# Patient Record
Sex: Female | Born: 1980 | Race: Black or African American | Hispanic: No | Marital: Single | Smoking: Never smoker
Health system: Southern US, Community
[De-identification: ages and names within clinical notes are randomized; demographics above are authoritative.]

## PROBLEM LIST (undated history)

## (undated) DIAGNOSIS — Z8619 Personal history of other infectious and parasitic diseases: Secondary | ICD-10-CM

## (undated) HISTORY — DX: Personal history of other infectious and parasitic diseases: Z86.19

---

## 2012-09-16 ENCOUNTER — Other Ambulatory Visit (HOSPITAL_COMMUNITY)
Admission: RE | Admit: 2012-09-16 | Discharge: 2012-09-16 | Disposition: A | Discharge status: Home / Self Care | Payer: BC Managed Care – PPO | Source: Ambulatory Visit | Attending: Family Medicine | Admitting: Family Medicine

## 2012-09-16 ENCOUNTER — Ambulatory Visit (INDEPENDENT_AMBULATORY_CARE_PROVIDER_SITE_OTHER): Payer: BC Managed Care – PPO | Admitting: Nurse Practitioner

## 2012-09-16 ENCOUNTER — Other Ambulatory Visit: Payer: Self-pay | Admitting: Nurse Practitioner

## 2012-09-16 ENCOUNTER — Encounter: Payer: Self-pay | Admitting: Nurse Practitioner

## 2012-09-16 VITALS — BP 100/70 | HR 86 | Temp 99.0°F | Ht 68.5 in | Wt 166.0 lb

## 2012-09-16 DIAGNOSIS — N760 Acute vaginitis: Secondary | ICD-10-CM | POA: Insufficient documentation

## 2012-09-16 DIAGNOSIS — Z113 Encounter for screening for infections with a predominantly sexual mode of transmission: Secondary | ICD-10-CM | POA: Insufficient documentation

## 2012-09-16 DIAGNOSIS — Z23 Encounter for immunization: Secondary | ICD-10-CM

## 2012-09-16 DIAGNOSIS — N939 Abnormal uterine and vaginal bleeding, unspecified: Secondary | ICD-10-CM

## 2012-09-16 DIAGNOSIS — N926 Irregular menstruation, unspecified: Secondary | ICD-10-CM

## 2012-09-16 DIAGNOSIS — Z111 Encounter for screening for respiratory tuberculosis: Secondary | ICD-10-CM

## 2012-09-16 DIAGNOSIS — Z0000 Encounter for general adult medical examination without abnormal findings: Secondary | ICD-10-CM

## 2012-09-16 DIAGNOSIS — R41840 Attention and concentration deficit: Secondary | ICD-10-CM

## 2012-09-16 DIAGNOSIS — Z1151 Encounter for screening for human papillomavirus (HPV): Secondary | ICD-10-CM | POA: Insufficient documentation

## 2012-09-16 DIAGNOSIS — Z124 Encounter for screening for malignant neoplasm of cervix: Secondary | ICD-10-CM

## 2012-09-16 DIAGNOSIS — E559 Vitamin D deficiency, unspecified: Secondary | ICD-10-CM

## 2012-09-16 DIAGNOSIS — Z01419 Encounter for gynecological examination (general) (routine) without abnormal findings: Secondary | ICD-10-CM | POA: Insufficient documentation

## 2012-09-16 LAB — HEPATIC FUNCTION PANEL
AST: 17 U/L (ref 0–37)
Alkaline Phosphatase: 51 U/L (ref 39–117)
Bilirubin, Direct: 0 mg/dL (ref 0.0–0.3)
Total Protein: 8.2 g/dL (ref 6.0–8.3)

## 2012-09-16 LAB — URINALYSIS, ROUTINE W REFLEX MICROSCOPIC
Nitrite: NEGATIVE
Total Protein, Urine: NEGATIVE
pH: 6 (ref 5.0–8.0)

## 2012-09-16 LAB — RENAL FUNCTION PANEL
Albumin: 4.4 g/dL (ref 3.5–5.2)
BUN: 7 mg/dL (ref 6–23)
CO2: 32 mEq/L (ref 19–32)
Chloride: 107 mEq/L (ref 96–112)
Creatinine, Ser: 0.8 mg/dL (ref 0.4–1.2)
Phosphorus: 3.6 mg/dL (ref 2.3–4.6)

## 2012-09-16 LAB — CBC
MCHC: 33 g/dL (ref 30.0–36.0)
Platelets: 296 10*3/uL (ref 150.0–400.0)
WBC: 6.6 10*3/uL (ref 4.5–10.5)

## 2012-09-16 MED ORDER — TETANUS-DIPHTH-ACELL PERTUSSIS 5-2.5-18.5 LF-MCG/0.5 IM SUSP
0.5000 mL | Freq: Once | INTRAMUSCULAR | Status: AC
  Administered 2012-09-16: 0.5 mL via INTRAMUSCULAR

## 2012-09-16 MED ORDER — TUBERCULIN PPD 5 UNIT/0.1ML ID SOLN
5.0000 [IU] | Freq: Once | INTRADERMAL | Status: AC
  Administered 2012-09-16: 5 [IU] via INTRADERMAL

## 2012-09-16 MED ORDER — HEPATITIS B VAC RECOMBINANT 5 MCG/0.5ML IJ SUSP
0.5000 mL | Freq: Once | INTRAMUSCULAR | Status: AC
  Administered 2012-09-16: 5 ug via INTRAMUSCULAR

## 2012-09-16 NOTE — Progress Notes (Signed)
Subjective:     Brooke Mckenzie is a 32 y.o. female and is here to establish care, get form filled out for job application, and complains of pain in pelvis when runs and spotting between menstrual cycles and when running or drinking alcohol. In addition, she thinks she has symptoms of attention deficit disorder.  History   Social History  . Marital Status: Single    Spouse Name: N/A    Number of Children: N/A  . Years of Education: N/A   Occupational History  . Not on file.   Social History Main Topics  . Smoking status: Never Smoker   . Smokeless tobacco: Never Used  . Alcohol Use: Yes     Comment: rare  . Drug Use: No  . Sexually Active: Yes   Other Topics Concern  . 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    No health maintenance topics applied.  The following portions of the patient's history were reviewed and updated as appropriate: allergies, current medications, past family history, past medical history, past social history, past surgical history and problem list.  Review of Systems Constitutional: negative for anorexia, chills, fatigue and night sweats Eyes: negative for irritation, redness and visual disturbance Ears, nose, mouth, throat, and face: negative for hoarseness, nasal congestion, sore mouth, sore throat and tinnitus Respiratory: negative for asthma, cough, dyspnea on exertion and wheezing Cardiovascular: negative for chest pain, chest pressure/discomfort, irregular heart beat and syncope Gastrointestinal: positive for abdominal pain, negative for change in bowel habits, constipation, diarrhea, nausea, reflux symptoms and vomiting Genitourinary:positive for bleeding between [facility] w/strenuous exercise , negative for dysuria and frequency Integument/breast: negative for breast lump, nipple discharge and rash Musculoskeletal:negative for arthralgias, back pain, muscle weakness and stiff  joints Neurological: positive for trouble focusing when reads, negative for coordination problems, dizziness, gait problems, headaches, memory problems, paresthesia and weakness Behavioral/Psych: negative for abusive relationship, anxiety, depression, excessive alcohol consumption, fatigue, illegal drug usage and irritability Endocrine: negative for diabetic symptoms including polydipsia, polyphagia and polyuria and temperature intolerance Allergic/Immunologic: negative for hay fever and urticaria  Skin: c/o blisters/hives when sweats Objective:    BP 100/70  Pulse 86  Temp(Src) 99 F (37.2 C) (Oral)  Ht 5' 8.5" (1.74 m)  Wt 166 lb (75.297 kg)  BMI 24.87 kg/m2  SpO2 99%  LMP 09/12/2012 General appearance: alert, cooperative, appears stated age and no distress Head: Normocephalic, without obvious abnormality, atraumatic Eyes: negative findings: lids and lashes normal, conjunctivae and sclerae normal, corneas clear and pupils equal, round, reactive to light and accomodation Ears: normal TM's and external ear canals both ears Throat: lips, mucosa, and tongue normal; teeth and gums normal and freckled tongue around periphery Back: symmetric, no curvature. ROM normal. No CVA tenderness. Lungs: clear to auscultation bilaterally Heart: regular rate and rhythm, S1, S2 normal, no murmur, click, rub or gallop Abdomen: soft, non-tender; bowel sounds normal; no masses,  no organomegaly Pelvic: adnexae not palpable, cervix normal in appearance, no adnexal masses or tenderness, no bladder tenderness, no cervical motion tenderness, perianal skin: no external genital warts noted, rectovaginal septum normal, uterus normal size, shape, and consistency and vagina normal without discharge Extremities: extremities normal, atraumatic, no cyanosis or edema Pulses: 2+ and symmetric Skin: Skin color, texture, turgor normal. No rashes or lesions Lymph nodes: Cervical, supraclavicular, and axillary nodes  normal. 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 Father had TB, rec PPD  cerv ca screen-PAP today 2 vit d def screen today 3 abnormal uterine bleeding-pelvic US STI screen today 4 problems maintaining attention, completing tasks   Plan:    1 CPE today. Abn findings: freckled tongue. Hx of blisters w/sweating-See pt instructions. Tdap & hep B #1 admin today, PPd admin today. 2 monitor results 3 monitor results, may coming from rectum-check blood in stool if findings normal 4 ref to psych for in depth eval for adult ADD

## 2012-09-16 NOTE — Patient Instructions (Addendum)
I will call you with results of labs. You will need to return for the reading of your PPD test in 48 hours. Also, return for Hep B vaccine #2 in one mo., then #3 in 4-5 months. For sweat/heat rash, use exfoliating cloths/gloves daily and only water soluble body lotions like [place], & loose clothes. Once I get the results of your PPD & HIV test, I will complete your form. You look great today, Pleasure to meet you!  Preventive Care for Adults, Female A healthy lifestyle and preventive care can promote health and wellness. Preventive health guidelines for women include the following key practices.  A routine yearly physical is a good way to check with your caregiver about your health and preventive screening. It is a chance to share any concerns and updates on your health, and to receive a thorough exam.  Visit your dentist for a routine exam and preventive care every 6 months. Brush your teeth twice a day and floss once a day. Good oral hygiene prevents tooth decay and gum disease.  The frequency of eye exams is based on your age, health, family medical history, use of contact lenses, and other factors. Follow your caregiver's recommendations for frequency of eye exams.  Eat a healthy diet. Foods like vegetables, fruits, whole grains, low-fat dairy products, and lean protein foods contain the nutrients you need without too many calories. Decrease your intake of foods high in solid fats, added sugars, and salt. Eat the right amount of calories for you.Get information about a proper diet from your caregiver, if necessary.  Regular physical exercise is one of the most important things you can do for your health. Most adults should get at least 150 minutes of moderate-intensity exercise (any activity that increases your heart rate and causes you to sweat) each week. In addition, most adults need muscle-strengthening exercises on 2 or more days a week.  Maintain a healthy weight. The body mass index (BMI)  is a screening tool to identify possible weight problems. It provides an estimate of body fat based on height and weight. Your caregiver can help determine your BMI, and can help you achieve or maintain a healthy weight.For adults 20 years and older:  A BMI below 18.5 is considered underweight.  A BMI of 18.5 to 24.9 is normal.  A BMI of 25 to 29.9 is considered overweight.  A BMI of 30 and above is considered obese.  Maintain normal blood lipids and cholesterol levels by exercising and minimizing your intake of saturated fat. Eat a balanced diet with plenty of fruit and vegetables. Blood tests for lipids and cholesterol should begin at age 11 and be repeated every 5 years. If your lipid or cholesterol levels are high, you are over 50, or you are at high risk for heart disease, you may need your cholesterol levels checked more frequently.Ongoing high lipid and cholesterol levels should be treated with medicines if diet and exercise are not effective.  If you smoke, find out from your caregiver how to quit. If you do not use tobacco, do not start.  If you are pregnant, do not drink alcohol. If you are breastfeeding, be very cautious about drinking alcohol. If you are not pregnant and choose to drink alcohol, do not exceed 1 drink per day. One drink is considered to be 12 ounces (355 mL) of beer, 5 ounces (148 mL) of wine, or 1.5 ounces (44 mL) of liquor.  Avoid use of street drugs. Do not share needles  with anyone. Ask for help if you need support or instructions about stopping the use of drugs.  High blood pressure causes heart disease and increases the risk of stroke. Your blood pressure should be checked at least every 1 to 2 years. Ongoing high blood pressure should be treated with medicines if weight loss and exercise are not effective.  If you are 69 to 31 years old, ask your caregiver if you should take aspirin to prevent strokes.  Diabetes screening involves taking a blood sample to  check your fasting blood sugar level. This should be done once every 3 years, after age 64, if you are within normal weight and without risk factors for diabetes. Testing should be considered at a younger age or be carried out more frequently if you are overweight and have at least 1 risk factor for diabetes.  Breast cancer screening is essential preventive care for women. You should practice "breast self-awareness." This means understanding the normal appearance and feel of your breasts and may include breast self-examination. Any changes detected, no matter how small, should be reported to a caregiver. Women in their 71s and 30s should have a clinical breast exam (CBE) by a caregiver as part of a regular health exam every 1 to 3 years. After age 67, women should have a CBE every year. Starting at age 71, women should consider having a mammography (breast X-ray test) every year. Women who have a family history of breast cancer should talk to their caregiver about genetic screening. Women at a high risk of breast cancer should talk to their caregivers about having magnetic resonance imaging (MRI) and a mammography every year.  The Pap test is a screening test for cervical cancer. A Pap test can show cell changes on the cervix that might become cervical cancer if left untreated. A Pap test is a procedure in which cells are obtained and examined from the lower end of the uterus (cervix).  Women should have a Pap test starting at age 88.  Between ages 55 and 66, Pap tests should be repeated every 2 years.  Beginning at age 61, you should have a Pap test every 3 years as long as the past 3 Pap tests have been normal.  Some women have medical problems that increase the chance of getting cervical cancer. Talk to your caregiver about these problems. It is especially important to talk to your caregiver if a new problem develops soon after your last Pap test. In these cases, your caregiver may recommend more  frequent screening and Pap tests.  The above recommendations are the same for women who have or have not gotten the vaccine for human papillomavirus (HPV).  If you had a hysterectomy for a problem that was not cancer or a condition that could lead to cancer, then you no longer need Pap tests. Even if you no longer need a Pap test, a regular exam is a good idea to make sure no other problems are starting.  If you are between ages 30 and 5, and you have had normal Pap tests going back 10 years, you no longer need Pap tests. Even if you no longer need a Pap test, a regular exam is a good idea to make sure no other problems are starting.  If you have had past treatment for cervical cancer or a condition that could lead to cancer, you need Pap tests and screening for cancer for at least 20 years after your treatment.  If Pap tests  have been discontinued, risk factors (such as a new sexual partner) need to be reassessed to determine if screening should be resumed.  The HPV test is an additional test that may be used for cervical cancer screening. The HPV test looks for the virus that can cause the cell changes on the cervix. The cells collected during the Pap test can be tested for HPV. The HPV test could be used to screen women aged 75 years and older, and should be used in women of any age who have unclear Pap test results. After the age of 69, women should have HPV testing at the same frequency as a Pap test.  Colorectal cancer can be detected and often prevented. Most routine colorectal cancer screening begins at the age of 55 and continues through age 28. However, your caregiver may recommend screening at an earlier age if you have risk factors for colon cancer. On a yearly basis, your caregiver may provide home test kits to check for hidden blood in the stool. Use of a small camera at the end of a tube, to directly examine the colon (sigmoidoscopy or colonoscopy), can detect the earliest forms of  colorectal cancer. Talk to your caregiver about this at age 88, when routine screening begins. Direct examination of the colon should be repeated every 5 to 10 years through age 83, unless early forms of pre-cancerous polyps or small growths are found.  Hepatitis C blood testing is recommended for all people born from [birth year] through 1965 and any individual with known risks for hepatitis C.  Practice safe sex. Use condoms and avoid high-risk sexual practices to reduce the spread of sexually transmitted infections (STIs). STIs include gonorrhea, chlamydia, syphilis, trichomonas, herpes, HPV, and human immunodeficiency virus (HIV). Herpes, HIV, and HPV are viral illnesses that have no cure. They can result in disability, cancer, and death. Sexually active women aged 18 and younger should be checked for chlamydia. Older women with new or multiple partners should also be tested for chlamydia. Testing for other STIs is recommended if you are sexually active and at increased risk.  Osteoporosis is a disease in which the bones lose minerals and strength with aging. This can result in serious bone fractures. The risk of osteoporosis can be identified using a bone density scan. Women ages 16 and over and women at risk for fractures or osteoporosis should discuss screening with their caregivers. Ask your caregiver whether you should take a calcium supplement or vitamin D to reduce the rate of osteoporosis.  Menopause can be associated with physical symptoms and risks. Hormone replacement therapy is available to decrease symptoms and risks. You should talk to your caregiver about whether hormone replacement therapy is right for you.  Use sunscreen with sun protection factor (SPF) of 30 or more. Apply sunscreen liberally and repeatedly throughout the day. You should seek shade when your shadow is shorter than you. Protect yourself by wearing long sleeves, pants, a wide-brimmed hat, and sunglasses year round, whenever  you are outdoors.  Once a month, do a whole body skin exam, using a mirror to look at the skin on your back. Notify your caregiver of new moles, moles that have irregular borders, moles that are larger than a pencil eraser, or moles that have changed in shape or color.  Stay current with required immunizations.  Influenza. You need a dose every fall (or winter). The composition of the flu vaccine changes each year, so being vaccinated once is not enough.  Pneumococcal polysaccharide.  You need 1 to 2 doses if you smoke cigarettes or if you have certain chronic medical conditions. You need 1 dose at age 71 (or older) if you have never been vaccinated.  Tetanus, diphtheria, pertussis (Tdap, Td). Get 1 dose of Tdap vaccine if you are younger than age 67, are over 64 and have contact with an infant, are a Research scientist (physical sciences), are pregnant, or simply want to be protected from whooping cough. After that, you need a Td booster dose every 10 years. Consult your caregiver if you have not had at least 3 tetanus and diphtheria-containing shots sometime in your life or have a deep or dirty wound.  HPV. You need this vaccine if you are a woman age 34 or younger. The vaccine is given in 3 doses over 6 months.  Measles, mumps, rubella (MMR). You need at least 1 dose of MMR if you were born in 1957 or later. You may also need a second dose.  Meningococcal. If you are age 46 to 46 and a first-year college student living in a residence hall, or have one of several medical conditions, you need to get vaccinated against meningococcal disease. You may also need additional booster doses.  Zoster (shingles). If you are age 27 or older, you should get this vaccine.  Varicella (chickenpox). If you have never had chickenpox or you were vaccinated but received only 1 dose, talk to your caregiver to find out if you need this vaccine.  Hepatitis A. You need this vaccine if you have a specific risk factor for hepatitis A virus  infection or you simply wish to be protected from this disease. The vaccine is usually given as 2 doses, 6 to 18 months apart.  Hepatitis B. You need this vaccine if you have a specific risk factor for hepatitis B virus infection or you simply wish to be protected from this disease. The vaccine is given in 3 doses, usually over 6 months. Preventive Services / Frequency Ages 23 to 29  Blood pressure check.** / Every 1 to 2 years.  Lipid and cholesterol check.** / Every 5 years beginning at age 63.  Clinical breast exam.** / Every 3 years for women in their 28s and 30s.  Pap test.** / Every 2 years from ages 64 through 33. Every 3 years starting at age 34 through age 90 or 6 with a history of 3 consecutive normal Pap tests.  HPV screening.** / Every 3 years from ages 71 through ages 40 to 78 with a history of 3 consecutive normal Pap tests.  Hepatitis C blood test.** / For any individual with known risks for hepatitis C.  Skin self-exam. / Monthly.  Influenza immunization.** / Every year.  Pneumococcal polysaccharide immunization.** / 1 to 2 doses if you smoke cigarettes or if you have certain chronic medical conditions.  Tetanus, diphtheria, pertussis (Tdap, Td) immunization. / A one-time dose of Tdap vaccine. After that, you need a Td booster dose every 10 years.  HPV immunization. / 3 doses over 6 months, if you are 27 and younger.  Measles, mumps, rubella (MMR) immunization. / You need at least 1 dose of MMR if you were born in 1957 or later. You may also need a second dose.  Meningococcal immunization. / 1 dose if you are age 28 to 85 and a first-year college student living in a residence hall, or have one of several medical conditions, you need to get vaccinated against meningococcal disease. You may also need additional booster doses.  Varicella immunization.** / Consult your caregiver.  Hepatitis A immunization.** / Consult your caregiver. 2 doses, 6 to 18 months  apart.  Hepatitis B immunization.** / Consult your caregiver. 3 doses usually over 6 months. Ages 51 to 82  Blood pressure check.** / Every 1 to 2 years.  Lipid and cholesterol check.** / Every 5 years beginning at age 81.  Clinical breast exam.** / Every year after age 68.  Mammogram.** / Every year beginning at age 1 and continuing for as long as you are in good health. Consult with your caregiver.  Pap test.** / Every 3 years starting at age 39 through age 43 or 16 with a history of 3 consecutive normal Pap tests.  HPV screening.** / Every 3 years from ages 42 through ages 28 to 100 with a history of 3 consecutive normal Pap tests.  Fecal occult blood test (FOBT) of stool. / Every year beginning at age 70 and continuing until age 33. You may not need to do this test if you get a colonoscopy every 10 years.  Flexible sigmoidoscopy or colonoscopy.** / Every 5 years for a flexible sigmoidoscopy or every 10 years for a colonoscopy beginning at age 68 and continuing until age 87.  Hepatitis C blood test.** / For all people born from 1 through 1965 and any individual with known risks for hepatitis C.  Skin self-exam. / Monthly.  Influenza immunization.** / Every year.  Pneumococcal polysaccharide immunization.** / 1 to 2 doses if you smoke cigarettes or if you have certain chronic medical conditions.  Tetanus, diphtheria, pertussis (Tdap, Td) immunization.** / A one-time dose of Tdap vaccine. After that, you need a Td booster dose every 10 years.  Measles, mumps, rubella (MMR) immunization. / You need at least 1 dose of MMR if you were born in 1957 or later. You may also need a second dose.  Varicella immunization.** / Consult your caregiver.  Meningococcal immunization.** / Consult your caregiver.  Hepatitis A immunization.** / Consult your caregiver. 2 doses, 6 to 18 months apart.  Hepatitis B immunization.** / Consult your caregiver. 3 doses, usually over 6 months. Ages 40  and over  Blood pressure check.** / Every 1 to 2 years.  Lipid and cholesterol check.** / Every 5 years beginning at age 39.  Clinical breast exam.** / Every year after age 36.  Mammogram.** / Every year beginning at age 44 and continuing for as long as you are in good health. Consult with your caregiver.  Pap test.** / Every 3 years starting at age 27 through age 68 or 48 with a 3 consecutive normal Pap tests. Testing can be stopped between 65 and 70 with 3 consecutive normal Pap tests and no abnormal Pap or HPV tests in the past 10 years.  HPV screening.** / Every 3 years from ages 37 through ages 27 or 45 with a history of 3 consecutive normal Pap tests. Testing can be stopped between 65 and 70 with 3 consecutive normal Pap tests and no abnormal Pap or HPV tests in the past 10 years.  Fecal occult blood test (FOBT) of stool. / Every year beginning at age 43 and continuing until age 75. You may not need to do this test if you get a colonoscopy every 10 years.  Flexible sigmoidoscopy or colonoscopy.** / Every 5 years for a flexible sigmoidoscopy or every 10 years for a colonoscopy beginning at age 31 and continuing until age 27.  Hepatitis C blood test.** / For all people born  from 1944 through 1965 and any individual with known risks for hepatitis C.  Osteoporosis screening.** / A one-time screening for women ages 73 and over and women at risk for fractures or osteoporosis.  Skin self-exam. / Monthly.  Influenza immunization.** / Every year.  Pneumococcal polysaccharide immunization.** / 1 dose at age 77 (or older) if you have never been vaccinated.  Tetanus, diphtheria, pertussis (Tdap, Td) immunization. / A one-time dose of Tdap vaccine if you are over 65 and have contact with an infant, are a Research scientist (physical sciences), or simply want to be protected from whooping cough. After that, you need a Td booster dose every 10 years.  Varicella immunization.** / Consult your  caregiver.  Meningococcal immunization.** / Consult your caregiver.  Hepatitis A immunization.** / Consult your caregiver. 2 doses, 6 to 18 months apart.  Hepatitis B immunization.** / Check with your caregiver. 3 doses, usually over 6 months. ** Family history and personal history of risk and conditions may change your caregiver's recommendations. Document Released: 04/24/2001 Document Revised: 05/21/2011 Document Reviewed: 07/24/2010 [facility] Patient Information 2014 [place], [place].

## 2012-09-17 ENCOUNTER — Ambulatory Visit (HOSPITAL_BASED_OUTPATIENT_CLINIC_OR_DEPARTMENT_OTHER)
Admission: RE | Admit: 2012-09-17 | Discharge: 2012-09-17 | Disposition: A | Discharge status: Home / Self Care | Payer: BC Managed Care – PPO | Source: Ambulatory Visit | Attending: Nurse Practitioner | Admitting: Nurse Practitioner

## 2012-09-17 DIAGNOSIS — N939 Abnormal uterine and vaginal bleeding, unspecified: Secondary | ICD-10-CM

## 2012-09-17 DIAGNOSIS — N949 Unspecified condition associated with female genital organs and menstrual cycle: Secondary | ICD-10-CM | POA: Insufficient documentation

## 2012-09-17 DIAGNOSIS — N938 Other specified abnormal uterine and vaginal bleeding: Secondary | ICD-10-CM | POA: Insufficient documentation

## 2012-09-19 ENCOUNTER — Other Ambulatory Visit (HOSPITAL_BASED_OUTPATIENT_CLINIC_OR_DEPARTMENT_OTHER): Payer: BC Managed Care – PPO

## 2012-09-23 ENCOUNTER — Telehealth: Payer: Self-pay | Admitting: Nurse Practitioner

## 2012-09-23 ENCOUNTER — Encounter: Payer: Self-pay | Admitting: Nurse Practitioner

## 2012-09-23 NOTE — Telephone Encounter (Signed)
See telephone note. Advised pt L ovary not visualized on pelvic US-may be due to gas in bowel. At f/u appt. I will recheck vit D and give hemoccult cards to screen for blood in stool as Hgb slightly low and pt c/o spot of blood on underwear when runs or drinks ETOH.

## 2012-09-24 ENCOUNTER — Telehealth: Payer: Self-pay | Admitting: Nurse Practitioner

## 2012-09-24 NOTE — Telephone Encounter (Signed)
Patient notified of appointment times.

## 2012-09-24 NOTE — Telephone Encounter (Signed)
VM is full. Need to give patient appt info.

## 2012-09-30 ENCOUNTER — Telehealth: Payer: Self-pay | Admitting: Nurse Practitioner

## 2012-09-30 DIAGNOSIS — D649 Anemia, unspecified: Secondary | ICD-10-CM

## 2012-09-30 NOTE — Telephone Encounter (Signed)
I am concerned about Peutz-Jeghers Syndrome as pt has freckled tongue and is mildly anemic and c/o vague abdominal pain. She gives family hx of her mother also having freckled tongue, but not to the extent that she has it. Her mother has had no GI, anemia, or Ca diagnoses that pt is aware of. Our office will mail ifob kit. Pt will return kit when she comes in for Hep B vaccine #2. She will also go to lab for iron studies.

## 2012-10-20 ENCOUNTER — Ambulatory Visit (INDEPENDENT_AMBULATORY_CARE_PROVIDER_SITE_OTHER): Payer: BC Managed Care – PPO | Admitting: Nurse Practitioner

## 2012-10-20 ENCOUNTER — Other Ambulatory Visit (HOSPITAL_COMMUNITY)
Admission: RE | Admit: 2012-10-20 | Discharge: 2012-10-20 | Disposition: A | Discharge status: Home / Self Care | Payer: BC Managed Care – PPO | Source: Ambulatory Visit | Attending: Family Medicine | Admitting: Family Medicine

## 2012-10-20 ENCOUNTER — Ambulatory Visit: Payer: BC Managed Care – PPO

## 2012-10-20 DIAGNOSIS — N760 Acute vaginitis: Secondary | ICD-10-CM | POA: Insufficient documentation

## 2012-10-20 DIAGNOSIS — N898 Other specified noninflammatory disorders of vagina: Secondary | ICD-10-CM

## 2012-10-20 DIAGNOSIS — Z23 Encounter for immunization: Secondary | ICD-10-CM

## 2012-10-20 NOTE — Addendum Note (Signed)
Addended by: Alben Spittle, Dayne Dekay [facility] on: 10/20/2012 12:50 PM   Modules accepted: Orders, Medications, Level of Service

## 2012-10-20 NOTE — Progress Notes (Signed)
Subjective:     Brooke Mckenzie is a 32 y.o. female who presents for evaluation of an abnormal vaginal discharge. Symptoms have been present for a few weeks. Vaginal symptoms: odor and Farro discharge. Contraception: none. She denies blisters, bumps, burning, lesions and local irritation Sexually transmitted infection risk: very low risk of STD exposure. Menstrual flow: regular every 28-30 days.  The following portions of the patient's history were reviewed and updated as appropriate: allergies, current medications, past family history, past medical history, past social history, past surgical history and problem list.   Review of Systems Constitutional: positive for anorexia, chills, fatigue, fevers, night sweats and weight loss Eyes: negative for irritation and redness Ears, nose, mouth, throat, and face: negative for sore throat Gastrointestinal: negative for abdominal pain and vomiting Genitourinary:positive for vaginal discharge and odor, negative for dysuria, frequency and hesitancy    Objective:    There were no vitals taken for this visit.  General Appearance:    Alert, cooperative, no distress, appears stated age  Head:    Normocephalic, without obvious abnormality, atraumatic  Eyes:  Lids & lashes no discharge, sclera Kleiber. L lid ptosis                             Abdomen:     Soft, non-tender, bowel sounds active all four quadrants,    no masses, no organomegaly  Genitalia:    Normal female without lesion, copious thin Navarrette discharge noted. No tenderness. Scant bleeding when swabbed cervix.                        Assessment:    Bacterial vaginosis.    Plan:    wet prep sent. see pt instructions.

## 2012-10-20 NOTE — Patient Instructions (Addendum)
I will call you with test results and advise treatment. Great to see you!  Bacterial Vaginosis Bacterial vaginosis (BV) is a vaginal infection where the normal balance of bacteria in the vagina is disrupted. The normal balance is then replaced by an overgrowth of certain bacteria. There are several different kinds of bacteria that can cause BV. BV is the most common vaginal infection in women of childbearing age. CAUSES   The cause of BV is not fully understood. BV develops when there is an increase or imbalance of harmful bacteria.  Some activities or behaviors can upset the normal balance of bacteria in the vagina and put women at increased risk including:  Having a new sex partner or multiple sex partners.  Douching.  Using an intrauterine device (IUD) for contraception.  It is not clear what role sexual activity plays in the development of BV. However, women that have never had sexual intercourse are rarely infected with BV. Women do not get BV from toilet seats, bedding, swimming pools or from touching objects around them.  SYMPTOMS   Grey vaginal discharge.  A fish-like odor with discharge, especially after sexual intercourse.  Itching or burning of the vagina and vulva.  Burning or pain with urination.  Some women have no signs or symptoms at all. DIAGNOSIS  Your caregiver must examine the vagina for signs of BV. Your caregiver will perform lab tests and look at the sample of vaginal fluid through a microscope. They will look for bacteria and abnormal cells (clue cells), a pH test higher than 4.5, and a positive amine test all associated with BV.  RISKS AND COMPLICATIONS   Pelvic inflammatory disease (PID).  Infections following gynecology surgery.  Developing HIV.  Developing herpes virus. TREATMENT  Sometimes BV will clear up without treatment. However, all women with symptoms of BV should be treated to avoid complications, especially if gynecology surgery is  planned. Female partners generally do not need to be treated. However, BV may spread between female sex partners so treatment is helpful in preventing a recurrence of BV.   BV may be treated with antibiotics. The antibiotics come in either pill or vaginal cream forms. Either can be used with nonpregnant or pregnant women, but the recommended dosages differ. These antibiotics are not harmful to the baby.  BV can recur after treatment. If this happens, a second round of antibiotics will often be prescribed.  Treatment is important for pregnant women. If not treated, BV can cause a premature delivery, especially for a pregnant woman who had a premature birth in the past. All pregnant women who have symptoms of BV should be checked and treated.  For chronic reoccurrence of BV, treatment with a type of prescribed gel vaginally twice a week is helpful. HOME CARE INSTRUCTIONS   Finish all medication as directed by your caregiver.  Do not have sex until treatment is completed.  Tell your sexual partner that you have a vaginal infection. They should see their caregiver and be treated if they have problems, such as a mild rash or itching.  Practice safe sex. Use condoms. Only have 1 sex partner. PREVENTION  Basic prevention steps can help reduce the risk of upsetting the natural balance of bacteria in the vagina and developing BV:  Do not have sexual intercourse (be abstinent).  Do not douche.  Use all of the medicine prescribed for treatment of BV, even if the signs and symptoms go away.  Tell your sex partner if you have BV. That  way, they can be treated, if needed, to prevent reoccurrence. SEEK MEDICAL CARE IF:   Your symptoms are not improving after 3 days of treatment.  You have increased discharge, pain, or fever. MAKE SURE YOU:   Understand these instructions.  Will watch your condition.  Will get help right away if you are not doing well or get worse. FOR MORE INFORMATION    Division of STD Prevention (DSTDP), Centers for Disease Control and Prevention: [employer] American Social Health Association (ASHA): www.ashastd.org  Document Released: 02/26/2005 Document Revised: 05/21/2011 Document Reviewed: 08/19/2008 [facility] Patient Information 2014 [place], [place].

## 2012-10-21 ENCOUNTER — Telehealth: Payer: Self-pay | Admitting: Nurse Practitioner

## 2012-10-21 DIAGNOSIS — B9689 Other specified bacterial agents as the cause of diseases classified elsewhere: Secondary | ICD-10-CM

## 2012-10-21 MED ORDER — METRONIDAZOLE 0.75 % VA GEL: 1.0000 | Freq: Two times a day (BID) | VAGINAL | Status: DC

## 2012-10-21 NOTE — Addendum Note (Signed)
Addended by: Alben Spittle, Konrad Felix COX on: 10/21/2012 10:54 AM   Modules accepted: Orders

## 2012-10-21 NOTE — Telephone Encounter (Signed)
Wet prep is positive for gardnerella, will treat with metrogel-vaginal. Discussed with pt.

## 2012-10-31 ENCOUNTER — Telehealth: Payer: Self-pay | Admitting: Nurse Practitioner

## 2012-10-31 DIAGNOSIS — B373 Candidiasis of vulva and vagina: Secondary | ICD-10-CM

## 2012-10-31 MED ORDER — MICONAZOLE NITRATE 1200 & 2 MG & % VA KIT: 1.0000 | PACK | Freq: Once | VAGINAL | Status: DC

## 2012-10-31 NOTE — Telephone Encounter (Signed)
pt called c/o vulvar itching times 1 day after completing 7 day course metronidazole vag suppositories. thinks starting mc 1 week early. Advised to use monistat vag supp, 7day with topical cream. call back if symptoms do not resolve

## 2012-11-03 ENCOUNTER — Other Ambulatory Visit (INDEPENDENT_AMBULATORY_CARE_PROVIDER_SITE_OTHER): Payer: BC Managed Care – PPO

## 2012-11-03 DIAGNOSIS — D649 Anemia, unspecified: Secondary | ICD-10-CM

## 2012-11-04 LAB — FECAL OCCULT BLOOD, IMMUNOCHEMICAL: Fecal Occult Bld: NEGATIVE

## 2012-12-15 ENCOUNTER — Other Ambulatory Visit: Payer: BC Managed Care – PPO

## 2012-12-22 ENCOUNTER — Ambulatory Visit: Payer: BC Managed Care – PPO | Admitting: Nurse Practitioner

## 2013-01-15 ENCOUNTER — Other Ambulatory Visit: Payer: Self-pay

## 2013-01-22 ENCOUNTER — Ambulatory Visit: Payer: Self-pay | Admitting: Internal Medicine

## 2013-01-26 ENCOUNTER — Ambulatory Visit: Payer: BC Managed Care – PPO

## 2013-02-24 ENCOUNTER — Telehealth: Payer: Self-pay | Admitting: Nurse Practitioner

## 2013-02-25 NOTE — Telephone Encounter (Signed)
LMOVM for patient to return call.

## 2013-03-24 ENCOUNTER — Telehealth: Payer: Self-pay | Admitting: Nurse Practitioner

## 2013-03-24 NOTE — Telephone Encounter (Signed)
Patient has not been taking the vitamin D supplement but is going to start today. She has scheduled an OV in 30 days to have her vit D level checked. She would like to know if you need to see her before then. Patient also lost the names of the people that you gave her to see for her ADD issues. Can she get those again?

## 2013-03-24 NOTE — Telephone Encounter (Signed)
Patient returned call and schedule/d appointment

## 2013-03-26 NOTE — Telephone Encounter (Signed)
Please call pt to set up OV at her earliest convenience. At that time she can have labs drawn for iron studies (were ordered last summer & not done) and get last Hep B vaccine. Too soon to do Vit D level. We can do that in 6 mos.

## 2013-03-30 NOTE — Telephone Encounter (Signed)
Brooke Mckenzie. [phone].

## 2013-03-30 NOTE — Telephone Encounter (Signed)
What was the name of person that you recommend for ADD issues?

## 2013-04-02 NOTE — Telephone Encounter (Signed)
Patient advised.

## 2013-04-21 ENCOUNTER — Encounter: Payer: Self-pay | Admitting: Nurse Practitioner

## 2013-04-21 ENCOUNTER — Ambulatory Visit (INDEPENDENT_AMBULATORY_CARE_PROVIDER_SITE_OTHER): Payer: BC Managed Care – PPO | Admitting: Nurse Practitioner

## 2013-04-21 VITALS — BP 116/80 | HR 77 | Temp 99.0°F | Resp 18 | Ht 68.5 in | Wt 174.0 lb

## 2013-04-21 DIAGNOSIS — K148 Other diseases of tongue: Secondary | ICD-10-CM | POA: Insufficient documentation

## 2013-04-21 DIAGNOSIS — D649 Anemia, unspecified: Secondary | ICD-10-CM | POA: Insufficient documentation

## 2013-04-21 DIAGNOSIS — Z23 Encounter for immunization: Secondary | ICD-10-CM

## 2013-04-21 DIAGNOSIS — Z9109 Other allergy status, other than to drugs and biological substances: Secondary | ICD-10-CM

## 2013-04-21 LAB — IRON AND TIBC
%SAT: 5 % — AB (ref 20–55)
IRON: 19 ug/dL — AB (ref 42–145)
TIBC: 411 ug/dL (ref 250–470)
UIBC: 392 ug/dL (ref 125–400)

## 2013-04-21 LAB — CBC WITH DIFFERENTIAL/PLATELET
BASOS PCT: 0.4 % (ref 0.0–3.0)
Basophils Absolute: 0 10*3/uL (ref 0.0–0.1)
EOS ABS: 0.1 10*3/uL (ref 0.0–0.7)
EOS PCT: 1.5 % (ref 0.0–5.0)
HEMATOCRIT: 32.1 % — AB (ref 36.0–46.0)
HEMOGLOBIN: 10.1 g/dL — AB (ref 12.0–15.0)
LYMPHS ABS: 1.4 10*3/uL (ref 0.7–4.0)
Lymphocytes Relative: 18.8 % (ref 12.0–46.0)
MCHC: 31.3 g/dL (ref 30.0–36.0)
MCV: 80.8 fl (ref 78.0–100.0)
MONO ABS: 0.4 10*3/uL (ref 0.1–1.0)
Monocytes Relative: 5.6 % (ref 3.0–12.0)
NEUTROS ABS: 5.5 10*3/uL (ref 1.4–7.7)
Neutrophils Relative %: 73.7 % (ref 43.0–77.0)
Platelets: 296 10*3/uL (ref 150.0–400.0)
RBC: 3.98 Mil/uL (ref 3.87–5.11)
RDW: 17.1 % — ABNORMAL HIGH (ref 11.5–14.6)
WBC: 7.5 10*3/uL (ref 4.5–10.5)

## 2013-04-21 LAB — FERRITIN: Ferritin: 4.5 ng/mL — ABNORMAL LOW (ref 10.0–291.0)

## 2013-04-21 NOTE — Assessment & Plan Note (Signed)
C/o itchy ears. NML exam. Will start sinus rinses daily. Let me know if no improvement.

## 2013-04-21 NOTE — Assessment & Plan Note (Addendum)
Mildly low hemoglobin 11.1. Fecal occult stool negative. Iron studies pending.

## 2013-04-21 NOTE — Progress Notes (Signed)
Subjective:     Brooke EvenerMishonna Mckenzie is a 33 y.o. female who presents for follow up of mild anemia found on routine labs at [facility]. Associated signs & symptoms: fatigue.  The following portions of the patient's history were reviewed and updated as appropriate: allergies, current medications, past medical history, past social history, past surgical history and problem list. Review of Systems Constitutional: positive for fatigue, negative for fevers and weight loss Ears, nose, mouth, throat, and face: positive for itchy ears, negative for hoarseness and nasal congestion Respiratory: negative for cough Cardiovascular: negative for chest pressure/discomfort, irregular heart beat, lower extremity edema and near-syncope Behavioral/Psych: negative for tobacco use Allergic/Immunologic: positive for itchy ears, negative for itchy throat or nasal congestion       Objective:    BP 116/80  Pulse 77  Temp(Src) 99 F (37.2 C) (Temporal)  Resp 18  Ht 5' 8.5" (1.74 m)  Wt 174 lb (78.926 kg)  BMI 26.07 kg/m2  SpO2 100%  LMP 03/27/2013 General appearance: alert, cooperative, appears stated age, no distress and weight up 8 lbs. in 6 mos. Head: Normocephalic, without obvious abnormality, atraumatic Eyes: negative findings: lids and lashes normal and conjunctivae and sclerae normal Throat: mucosa moist, pink, scattered dark freckles on tongue. Lungs: clear to auscultation bilaterally Heart: regular rate and rhythm, S1, S2 normal, no murmur, click, rub or gallop Lymph nodes: enlarged anterior tonsillar nodes, bilat, NT, moveable.    Assessment:    Anemia, origin unknown , mild, reports fatigue Itchy ears, nml exam. DD: environmental allergies, psoriasis  Plan:    labs: CBC iron studies  Daily sinus rinses to decrease allergenic load. Allergy meds if no improvement. F/u in 1 yr or sooner if labs abnml.

## 2013-04-21 NOTE — Progress Notes (Signed)
Pre visit review using our clinic review tool, if applicable. No additional management support is needed unless otherwise documented below in the visit note. 

## 2013-04-21 NOTE — Patient Instructions (Signed)
For itchy ears, you may have allergies. Start sinus rinses Milta Deiters med sinus rinse) daily to decrease allergenic load. Let me know if this does not help. Our office will call you with lab results and any treatment needed.  Develop lifelong habits of exercise most days of the week: take a 30 minute walk. The benefits include weight loss, lower risk for heart disease, diabetes, stroke, high blood pressure, lower rates of depression & dementia, better sleep quality & bone health.  Great to see you! Best wishes with new business!  Allergies Allergies may happen from anything your body is sensitive to. This may be food, medicines, pollens, chemicals, and nearly anything around you in everyday life that produces allergens. An allergen is anything that causes an allergy producing substance. Heredity is often a factor in causing these problems. This means you may have some of the same allergies as your parents. Food allergies happen in all age groups. Food allergies are some of the most severe and life threatening. Some common food allergies are cow's milk, seafood, eggs, nuts, wheat, and soybeans. SYMPTOMS   Swelling around the mouth.  An itchy red rash or hives.  Vomiting or diarrhea.  Difficulty breathing. SEVERE ALLERGIC REACTIONS ARE LIFE-THREATENING. This reaction is called anaphylaxis. It can cause the mouth and throat to swell and cause difficulty with breathing and swallowing. In severe reactions only a trace amount of food (for example, peanut oil in a salad) may cause death within seconds. Seasonal allergies occur in all age groups. These are seasonal because they usually occur during the same season every year. They may be a reaction to molds, grass pollens, or tree pollens. Other causes of problems are house dust mite allergens, pet dander, and mold spores. The symptoms often consist of nasal congestion, a runny itchy nose associated with sneezing, and tearing itchy eyes. There is often an  associated itching of the mouth and ears. The problems happen when you come in contact with pollens and other allergens. Allergens are the particles in the air that the body reacts to with an allergic reaction. This causes you to release allergic antibodies. Through a chain of events, these eventually cause you to release histamine into the blood stream. Although it is meant to be protective to the body, it is this release that causes your discomfort. This is why you were given anti-histamines to feel better. If you are unable to pinpoint the offending allergen, it may be determined by skin or blood testing. Allergies cannot be cured but can be controlled with medicine. Hay fever is a collection of all or some of the seasonal allergy problems. It may often be treated with simple over-the-counter medicine such as diphenhydramine. Take medicine as directed. Do not drink alcohol or drive while taking this medicine. Check with your caregiver or package insert for child dosages. If these medicines are not effective, there are many new medicines your caregiver can prescribe. Stronger medicine such as nasal spray, eye drops, and corticosteroids may be used if the first things you try do not work well. Other treatments such as immunotherapy or desensitizing injections can be used if all else fails. Follow up with your caregiver if problems continue. These seasonal allergies are usually not life threatening. They are generally more of a nuisance that can often be handled using medicine. HOME CARE INSTRUCTIONS   If unsure what causes a reaction, keep a diary of foods eaten and symptoms that follow. Avoid foods that cause reactions.  If hives or  rash are present:  Take medicine as directed.  You may use an over-the-counter antihistamine (diphenhydramine) for hives and itching as needed.  Apply cold compresses (cloths) to the skin or take baths in cool water. Avoid hot baths or showers. Heat will make a rash and  itching worse.  If you are severely allergic:  Following a treatment for a severe reaction, hospitalization is often required for closer follow-up.  Wear a medic-alert bracelet or necklace stating the allergy.  You and your family must learn how to give adrenaline or use an anaphylaxis kit.  If you have had a severe reaction, always carry your anaphylaxis kit or EpiPen with you. Use this medicine as directed by your caregiver if a severe reaction is occurring. Failure to do so could have a fatal outcome. SEEK MEDICAL CARE IF:  You suspect a food allergy. Symptoms generally happen within 30 minutes of eating a food.  Your symptoms have not gone away within 2 days or are getting worse.  You develop new symptoms.  You want to retest yourself or your child with a food or drink you think causes an allergic reaction. Never do this if an anaphylactic reaction to that food or drink has happened before. Only do this under the care of a caregiver. SEEK IMMEDIATE MEDICAL CARE IF:   You have difficulty breathing, are wheezing, or have a tight feeling in your chest or throat.  You have a swollen mouth, or you have hives, swelling, or itching all over your body.  You have had a severe reaction that has responded to your anaphylaxis kit or an EpiPen. These reactions may return when the medicine has worn off. These reactions should be considered life threatening. MAKE SURE YOU:   Understand these instructions.  Will watch your condition.  Will get help right away if you are not doing well or get worse. Document Released: 05/22/2002 Document Revised: 06/23/2012 Document Reviewed: 10/27/2007 [facility] Patient Information 2014 [place].

## 2013-04-22 ENCOUNTER — Other Ambulatory Visit: Payer: Self-pay | Admitting: Nurse Practitioner

## 2013-04-22 DIAGNOSIS — D509 Iron deficiency anemia, unspecified: Secondary | ICD-10-CM

## 2013-04-22 MED ORDER — FERROUS SULFATE 325 (65 FE) MG PO TABS: ORAL_TABLET | ORAL | Status: AC

## 2013-05-21 ENCOUNTER — Encounter: Payer: Self-pay | Admitting: Nurse Practitioner

## 2013-05-21 DIAGNOSIS — F988 Other specified behavioral and emotional disorders with onset usually occurring in childhood and adolescence: Secondary | ICD-10-CM | POA: Insufficient documentation

## 2013-06-22 ENCOUNTER — Ambulatory Visit: Payer: BC Managed Care – PPO | Admitting: Nurse Practitioner

## 2013-07-15 ENCOUNTER — Encounter: Payer: Self-pay | Admitting: Nurse Practitioner

## 2013-08-06 ENCOUNTER — Encounter: Payer: Self-pay | Admitting: Nurse Practitioner

## 2014-12-11 IMAGING — US US TRANSVAGINAL NON-OB
1 series · 14 of 25 positions shown · non-contrast
Comparison: None

CLINICAL DATA: Dysfunctional uterine bleeding.

TRANSABDOMINAL AND TRANSVAGINAL ULTRASOUND OF PELVIS
TECHNIQUE: Both transabdominal and transvaginal ultrasound
examinations of the pelvis were performed including evaluation of
the uterus, ovaries, adnexal regions, and pelvic cul-de-sac.

[Series 1: us transvaginal non-ob · 0.33mm/px · 14 of 31 slices shown]
[im 1/31]
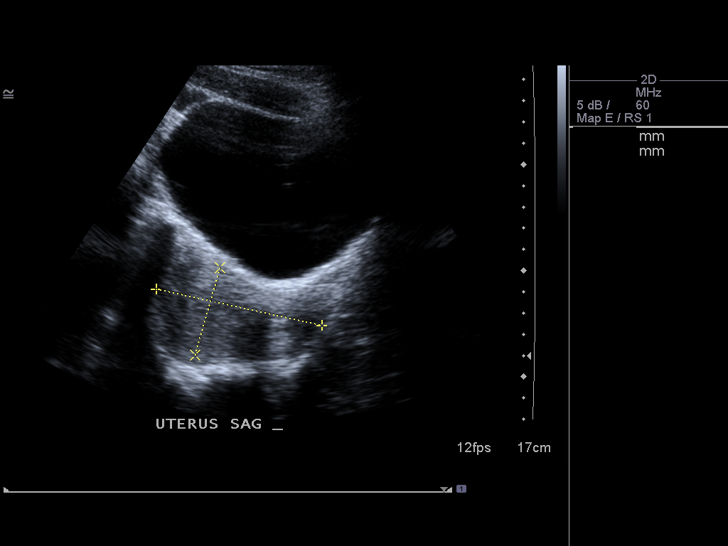
[im 3/31]
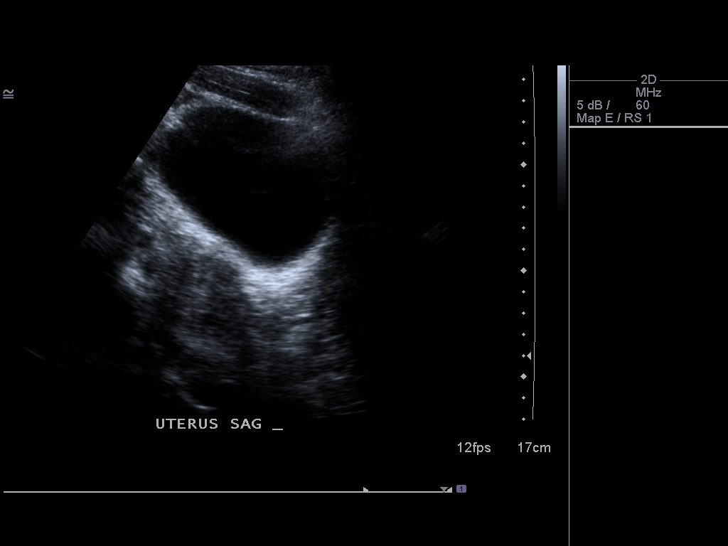
[im 6/31]
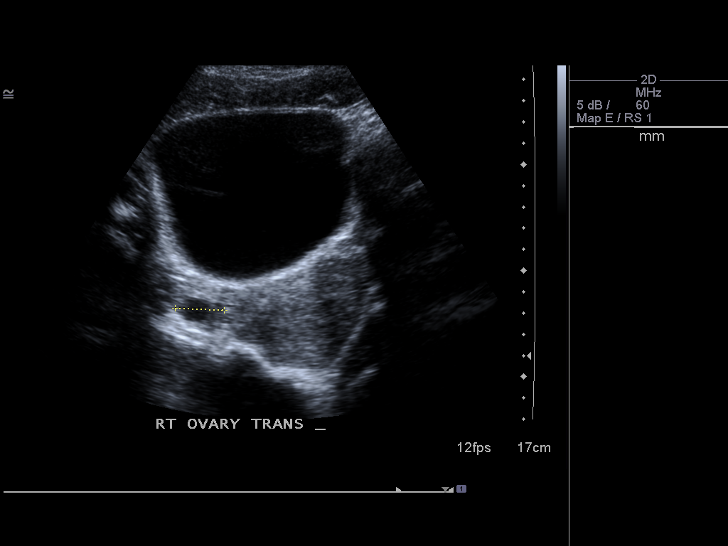
[im 8/31]
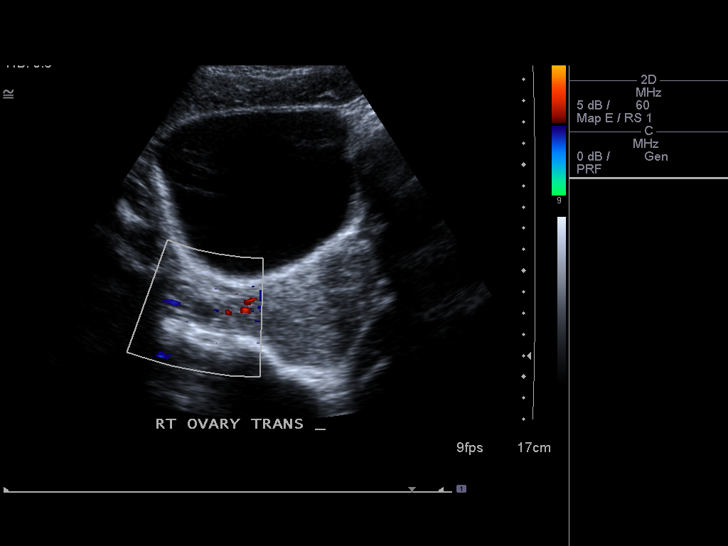
[im 11/31]
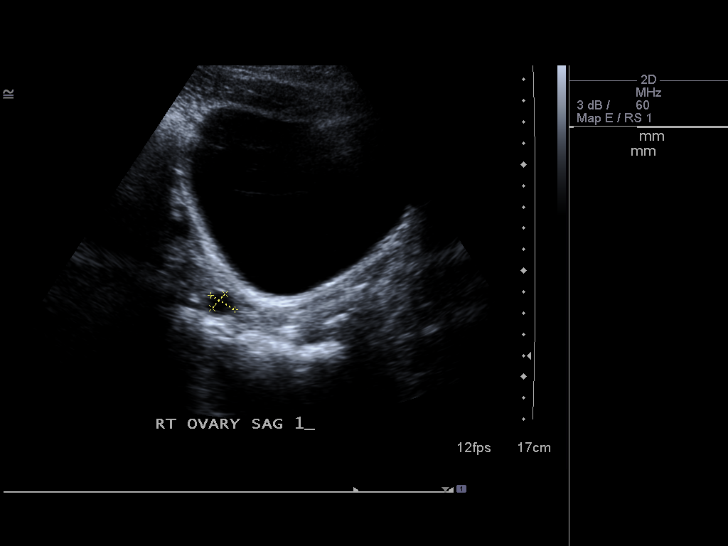
[im 12/31]
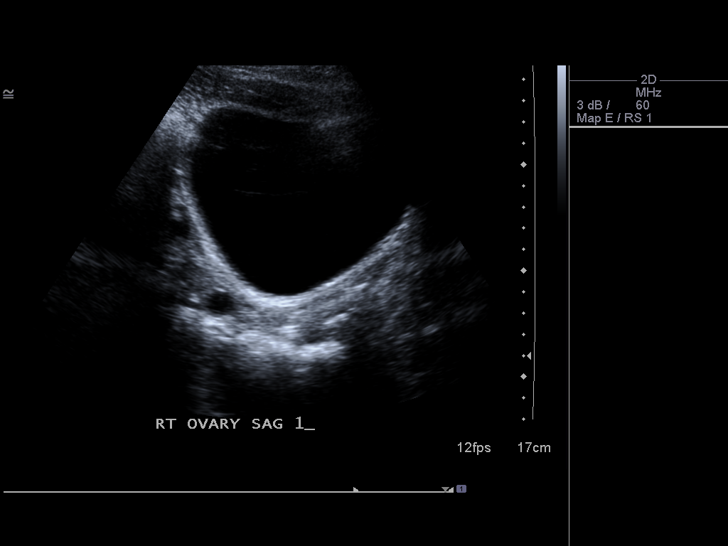
[im 14/31]
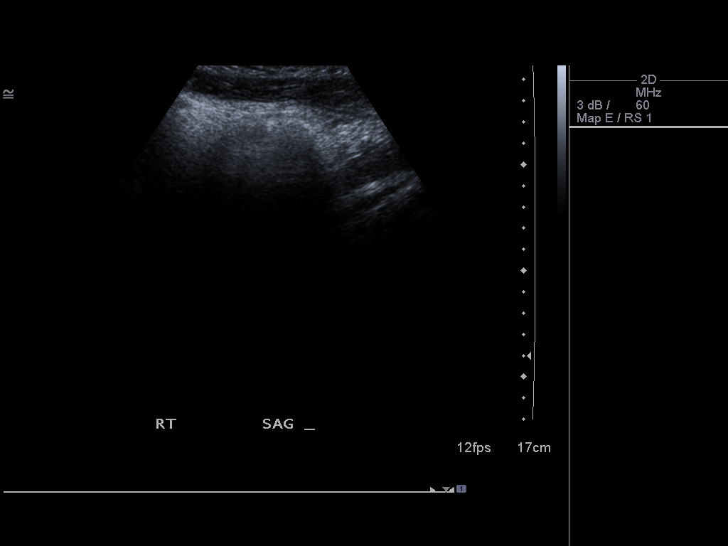
[im 17/31]
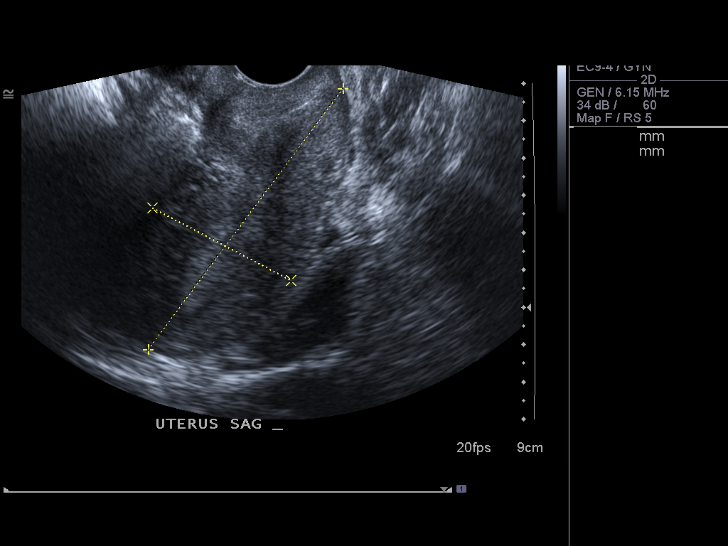
[im 19/31]
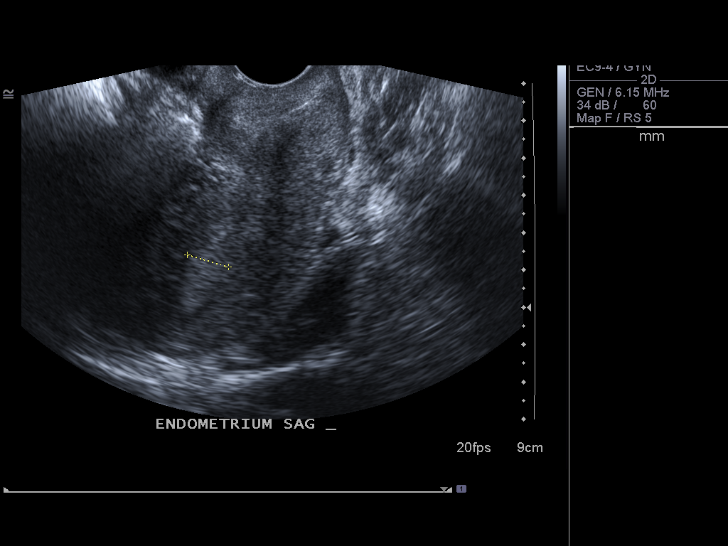
[im 21/31]
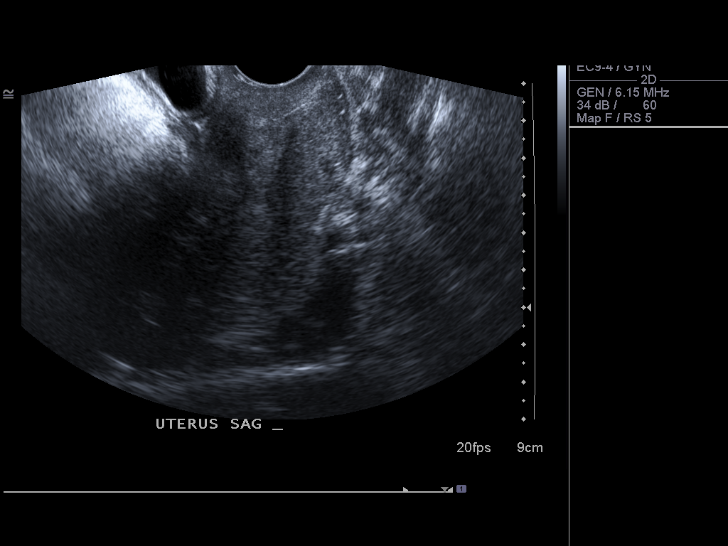
[im 23/31]
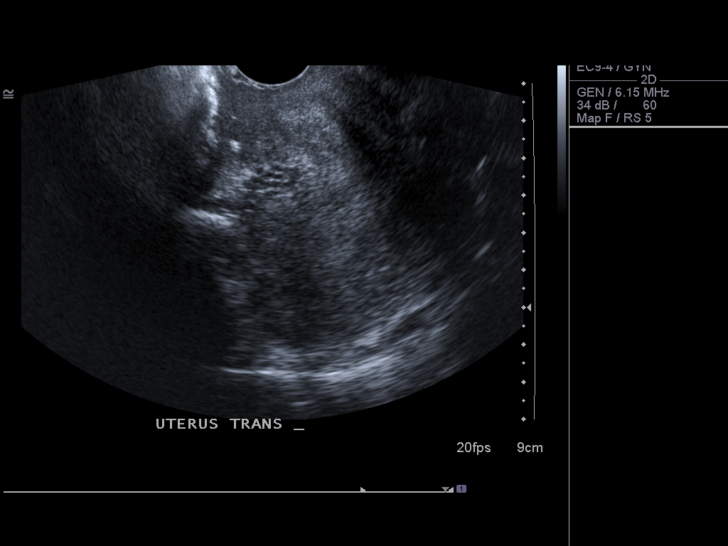
[im 26/31]
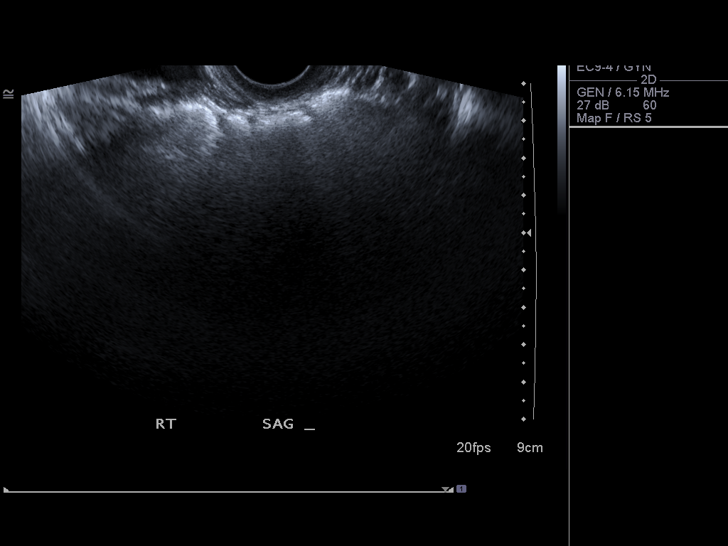
[im 28/31]
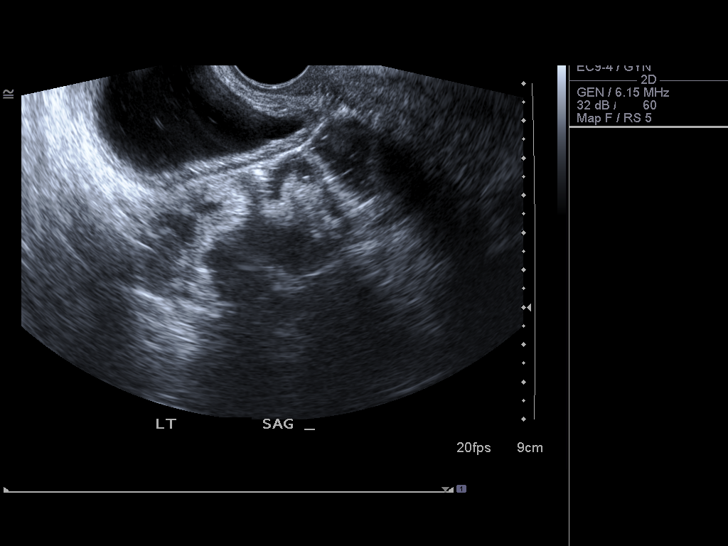
[im 31/31]
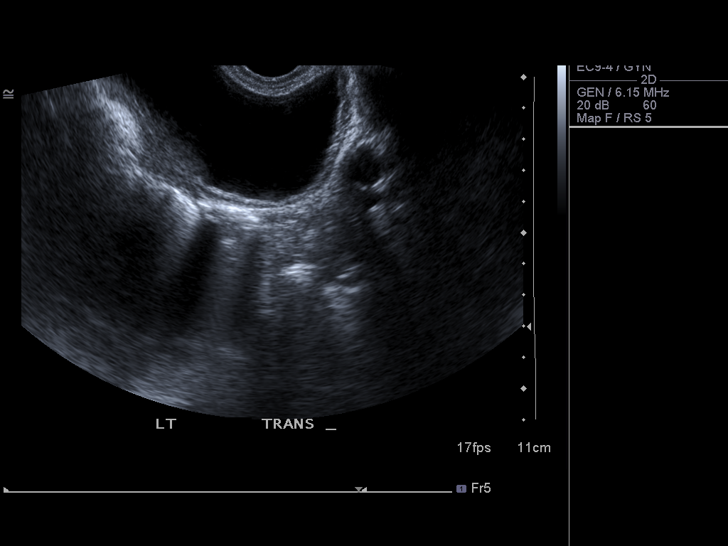

[14 of 25 positions shown; findings below may reference images not displayed]

FINDINGS: Uterus: 8.0 x 4.3 x 5.5 cm.  No focal abnormality visualized.
Normal echotexture.

Endometrium: Normal appearance and thickness, 12 mm.

Right Ovary: 3.0 x 1.4 x 2.3 cm.  Small follicle. Normal size and
echotexture.  No adnexal masses.

Left Ovary: Not visualized.  No adnexal masses.

Other Findings:  No free fluid.  Study and visualization is limited
by excessive bowel gas in the pelvis and inability the patient to
empty her bladder.
IMPRESSION: No visualized abnormality.

## 2019-02-11 ENCOUNTER — Other Ambulatory Visit: Payer: Self-pay

## 2019-02-11 DIAGNOSIS — Z20822 Contact with and (suspected) exposure to covid-19: Secondary | ICD-10-CM

## 2019-02-14 LAB — NOVEL CORONAVIRUS, NAA: SARS-CoV-2, NAA: NOT DETECTED
